# Patient Record
Sex: Male | Born: 1995 | Race: Black or African American | Hispanic: No | Marital: Single | Smoking: Never smoker
Health system: Southern US, Community
[De-identification: ages and names within clinical notes are randomized; demographics above are authoritative.]

## PROBLEM LIST (undated history)

## (undated) DIAGNOSIS — G43909 Migraine, unspecified, not intractable, without status migrainosus: Secondary | ICD-10-CM

---
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
## 2018-01-20 ENCOUNTER — Encounter (HOSPITAL_COMMUNITY): Payer: Self-pay | Admitting: Emergency Medicine

## 2018-01-20 ENCOUNTER — Ambulatory Visit (HOSPITAL_COMMUNITY)
Admission: EM | Admit: 2018-01-20 | Discharge: 2018-01-20 | Disposition: A | Discharge status: Home / Self Care | Payer: BLUE CROSS/BLUE SHIELD | Attending: Family Medicine | Admitting: Family Medicine

## 2018-01-20 DIAGNOSIS — R109 Unspecified abdominal pain: Secondary | ICD-10-CM | POA: Diagnosis present

## 2018-01-20 DIAGNOSIS — R1013 Epigastric pain: Secondary | ICD-10-CM | POA: Insufficient documentation

## 2018-01-20 DIAGNOSIS — R112 Nausea with vomiting, unspecified: Secondary | ICD-10-CM | POA: Insufficient documentation

## 2018-01-20 LAB — CBC WITH DIFFERENTIAL/PLATELET
Abs Immature Granulocytes: 0 10*3/uL (ref 0.0–0.1)
BASOS ABS: 0 10*3/uL (ref 0.0–0.1)
BASOS PCT: 0 %
EOS ABS: 0 10*3/uL (ref 0.0–0.7)
EOS PCT: 0 %
HCT: 51.2 % (ref 39.0–52.0)
Hemoglobin: 17.5 g/dL — ABNORMAL HIGH (ref 13.0–17.0)
IMMATURE GRANULOCYTES: 0 %
LYMPHS ABS: 1.6 10*3/uL (ref 0.7–4.0)
Lymphocytes Relative: 18 %
MCH: 30.3 pg (ref 26.0–34.0)
MCHC: 34.2 g/dL (ref 30.0–36.0)
MCV: 88.6 fL (ref 78.0–100.0)
Monocytes Absolute: 0.9 10*3/uL (ref 0.1–1.0)
Monocytes Relative: 10 %
NEUTROS PCT: 72 %
Neutro Abs: 6.5 10*3/uL (ref 1.7–7.7)
PLATELETS: 202 10*3/uL (ref 150–400)
RBC: 5.78 MIL/uL (ref 4.22–5.81)
RDW: 11.8 % (ref 11.5–15.5)
WBC: 9.1 10*3/uL (ref 4.0–10.5)

## 2018-01-20 LAB — COMPREHENSIVE METABOLIC PANEL
ALK PHOS: 87 U/L (ref 38–126)
ALT: 24 U/L (ref 17–63)
ANION GAP: 11 (ref 5–15)
AST: 27 U/L (ref 15–41)
Albumin: 4.8 g/dL (ref 3.5–5.0)
BUN: 17 mg/dL (ref 6–20)
CALCIUM: 10.5 mg/dL — AB (ref 8.9–10.3)
CO2: 35 mmol/L — AB (ref 22–32)
Chloride: 93 mmol/L — ABNORMAL LOW (ref 101–111)
Creatinine, Ser: 1.28 mg/dL — ABNORMAL HIGH (ref 0.61–1.24)
Glucose, Bld: 119 mg/dL — ABNORMAL HIGH (ref 65–99)
Potassium: 4.2 mmol/L (ref 3.5–5.1)
SODIUM: 139 mmol/L (ref 135–145)
TOTAL PROTEIN: 8.4 g/dL — AB (ref 6.5–8.1)
Total Bilirubin: 1.2 mg/dL (ref 0.3–1.2)

## 2018-01-20 LAB — LIPASE, BLOOD: LIPASE: 23 U/L (ref 11–51)

## 2018-01-20 MED ORDER — ONDANSETRON 4 MG PO TBDP
4.0000 mg | ORAL_TABLET | Freq: Once | ORAL | Status: AC
  Administered 2018-01-20: 4 mg via ORAL

## 2018-01-20 MED ORDER — ONDANSETRON 4 MG PO TBDP
ORAL_TABLET | ORAL | Status: AC
  Filled 2018-01-20: qty 1

## 2018-01-20 MED ORDER — ONDANSETRON 4 MG PO TBDP: 4.0000 mg | ORAL_TABLET | Freq: Three times a day (TID) | ORAL | 0 refills | Status: DC | PRN

## 2018-01-20 NOTE — ED Triage Notes (Signed)
Pt c/o upper abdominal pain x3 days, c/o waking up in the middle of the night vomiting.

## 2018-01-20 NOTE — ED Provider Notes (Signed)
[facility]    CSN: [id number] Arrival date & time: 01/20/18  [id number]     History   Chief Complaint Chief Complaint  Patient presents with  . Abdominal Pain    HPI Ian Daniel is a 22 y.o. male.   Ian Daniel presents with complaints of nausea vomiting and upper abdominal pain which started three days ago. States throat burns. No current abdominal pain. States the vomiting has been quite frequent, he feels it is worse if he stands up. If he hasn't eaten then it is bile which comes up. Last episode of vomiting approximately 2 hours ago. No diarrhea. States had a Bm this morning but has gone a little longer than normal for him without having a BM. Denies any previous similar. No recent travel. No known contaminated food intake prior to symptoms on set. No known ill contacts. No fevers. Has been drinking fluids but has had decreased food intake. Urinating normally. Has not taken any medications for symptoms. States drinks occasional alcohol and denies any illicit drug use. Without contributing medical history.      ROS per HPI.      Past Medical History:  Diagnosis Date  . Migraines     There are no active problems to display for this patient.   Past Surgical History:  Procedure Laterality Date  . I&D EXTREMITY Left 02/13/2013   Procedure: ORIF of left distal phalanx of fifth finger and nailbed repair;  Surgeon: Jodi Marbleavid A Thompson, MD;  [facility];  Service: Orthopedics;  Laterality: Left;       Home Medications    Prior to Admission medications   Medication Sig Start Date End Date Taking? Authorizing Provider  ondansetron (ZOFRAN-ODT) 4 MG disintegrating tablet Take 1 tablet (4 mg total) by mouth every 8 (eight) hours as needed for nausea or vomiting. 01/20/18   Georgetta HaberBurky, Natalie B, NP    Family History Family History  Problem Relation Age of Onset  . Fibromyalgia Paternal Grandmother     Social History Social History   Tobacco Use  . Smoking status: Never  Smoker  Substance Use Topics  . Alcohol use: Not on file  . Drug use: Not on file     Allergies   Patient has no known allergies.   Review of Systems Review of Systems   Physical Exam Triage Vital Signs ED Triage Vitals [01/20/18 1012]  Enc Vitals Group     BP 122/80     Pulse Rate 60     Resp 18     Temp 98 F (36.7 C)     Temp src      SpO2 99 %     Weight      Height      Head Circumference      Peak Flow      Pain Score      Pain Loc      Pain Edu?      Excl. in GC?    No data found.  Updated Vital Signs BP 122/80   Pulse 60   Temp 98 F (36.7 C)   Resp 18   SpO2 99%    Physical Exam  Constitutional: He is oriented to person, place, and time. He appears well-developed and well-nourished.  Cardiovascular: Normal rate and regular rhythm.  Pulmonary/Chest: Effort normal and breath sounds normal.  Abdominal: Soft. Bowel sounds are normal. There is no tenderness. There is no rigidity, no rebound, no guarding, no CVA tenderness, no tenderness  at McBurney's point and negative Murphy's sign.  Without any acute abdominal findings on exam; non tender  Neurological: He is alert and oriented to person, place, and time.  Skin: Skin is warm and dry.     UC Treatments / Results  Labs (all labs ordered are listed, but only abnormal results are displayed) Labs Reviewed  CBC WITH DIFFERENTIAL/PLATELET  COMPREHENSIVE METABOLIC PANEL  LIPASE, BLOOD    EKG None  Radiology No results found.  Procedures Procedures (including critical care time)  Medications Ordered in UC Medications  ondansetron (ZOFRAN-ODT) disintegrating tablet 4 mg (has no administration in time range)    Initial Impression / Assessment and Plan / UC Course  I have reviewed the triage vital signs and the nursing notes.  Pertinent labs & imaging results that were available during my care of the patient were reviewed by me and considered in my medical decision making (see chart for  details).     Afebrile, without tachycardia. Non toxic in exam, without acute abdominal findings. Labs collected, will call patient with any concerning results, does not have a PCP. zofran as needed. Liquids to be advanced to bland diet as tolerated. Return precautions provided. Patient verbalized understanding and agreeable to plan.    Final Clinical Impressions(s) / UC Diagnoses   Final diagnoses:  Non-intractable vomiting with nausea, unspecified vomiting type  Epigastric pain     Discharge Instructions     Small frequent sips of fluids- Pedialyte, Gatorade, water, broth- to maintain hydration.   Jello, broth or soups and liquids are important today, especially if still having pain. Zofran as needed for nausea.  If pain and nausea have improved may advance to bland diet as discussed in following pages.  If this is tolerated may advance to regular diet as tolerated. I will call if you have any concerning lab values or you may also check on your MyChart for my comments on your labs. If you develop worsening of pain, fevers, dehydration, no improvement in the next 48 hours or otherwise worsening please go to the Er.    ED Prescriptions    Medication Sig Dispense Auth. Provider   ondansetron (ZOFRAN-ODT) 4 MG disintegrating tablet Take 1 tablet (4 mg total) by mouth every 8 (eight) hours as needed for nausea or vomiting. 12 tablet Georgetta Haber, NP     Controlled Substance Prescriptions  Controlled Substance Registry consulted? Not Applicable   Georgetta Haber, NP 01/20/18 1037

## 2018-01-20 NOTE — Discharge Instructions (Signed)
Small frequent sips of fluids- Pedialyte, Gatorade, water, broth- to maintain hydration.   Jello, broth or soups and liquids are important today, especially if still having pain. Zofran as needed for nausea.  If pain and nausea have improved may advance to bland diet as discussed in following pages.  If this is tolerated may advance to regular diet as tolerated. I will call if you have any concerning lab values or you may also check on your MyChart for my comments on your labs. If you develop worsening of pain, fevers, dehydration, no improvement in the next 48 hours or otherwise worsening please go to the Er.

## 2018-04-29 ENCOUNTER — Encounter (HOSPITAL_COMMUNITY): Payer: Self-pay | Admitting: Emergency Medicine

## 2018-04-29 ENCOUNTER — Ambulatory Visit (HOSPITAL_COMMUNITY)
Admission: EM | Admit: 2018-04-29 | Discharge: 2018-04-29 | Disposition: A | Discharge status: Home / Self Care | Payer: BLUE CROSS/BLUE SHIELD | Attending: Urgent Care | Admitting: Urgent Care

## 2018-04-29 DIAGNOSIS — J029 Acute pharyngitis, unspecified: Secondary | ICD-10-CM | POA: Diagnosis not present

## 2018-04-29 DIAGNOSIS — R070 Pain in throat: Secondary | ICD-10-CM

## 2018-04-29 MED ORDER — AMOXICILLIN 400 MG/5ML PO SUSR: 800.0000 mg | Freq: Two times a day (BID) | ORAL | 0 refills | Status: DC

## 2018-04-29 NOTE — ED Provider Notes (Signed)
  MRN: [id number] DOB: 12/02/1995  Subjective:   Ian Daniel is a 22 y.o. male presenting for 4-day history of worsening sore throat, difficulty swallowing, difficulty speaking, subjective fever, malaise.  Patient has tried over-the-counter medications with minimal relief.  He is not currently taking any chronic medications.  No Known Allergies  Past Medical History:  Diagnosis Date  . Migraines      Past Surgical History:  Procedure Laterality Date  . I&D EXTREMITY Left 02/13/2013   Procedure: ORIF of left distal phalanx of fifth finger and nailbed repair;  Surgeon: Jodi Marble, MD;  Location: [facility] OR;  Service: Orthopedics;  Laterality: Left;    Objective:   Vitals: BP 122/77   Pulse 81   Temp 99.1 F (37.3 C)   Resp 16   SpO2 99%   Physical Exam  Constitutional: He is oriented to person, place, and time. He appears well-developed and well-nourished.  HENT:  Mouth/Throat: Oropharyngeal exudate and posterior oropharyngeal erythema (with associated tonsillar edema but airway is patent) present.  Malodorous breath.  Cardiovascular: Normal rate.  Pulmonary/Chest: Effort normal.  Neurological: He is alert and oriented to person, place, and time.   Assessment and Plan :   Pharyngitis, unspecified etiology  Throat pain  We will treat empirically for bacterial pharyngitis, start amoxicillin and use Tylenol and ibuprofen for supportive care. Return-to-clinic precautions discussed, patient verbalized understanding.    Wallis Bamberg, PA-C 04/29/18 1229

## 2018-04-29 NOTE — ED Triage Notes (Signed)
Pt c/o sore throat x4days.

## 2018-04-29 NOTE — Discharge Instructions (Signed)
You may take 500mg Tylenol with ibuprofen 400-600mg every 6 hours for pain and inflammation. °

## 2019-05-21 ENCOUNTER — Other Ambulatory Visit: Payer: Self-pay

## 2019-05-21 ENCOUNTER — Encounter (HOSPITAL_COMMUNITY): Payer: Self-pay

## 2019-05-21 ENCOUNTER — Ambulatory Visit (HOSPITAL_COMMUNITY)
Admission: EM | Admit: 2019-05-21 | Discharge: 2019-05-21 | Disposition: A | Discharge status: Home / Self Care | Payer: BC Managed Care – PPO | Attending: Family | Admitting: Family

## 2019-05-21 DIAGNOSIS — Z20828 Contact with and (suspected) exposure to other viral communicable diseases: Secondary | ICD-10-CM | POA: Diagnosis present

## 2019-05-21 DIAGNOSIS — R110 Nausea: Secondary | ICD-10-CM

## 2019-05-21 DIAGNOSIS — R197 Diarrhea, unspecified: Secondary | ICD-10-CM

## 2019-05-21 DIAGNOSIS — R0981 Nasal congestion: Secondary | ICD-10-CM | POA: Diagnosis present

## 2019-05-21 DIAGNOSIS — R1013 Epigastric pain: Secondary | ICD-10-CM | POA: Diagnosis present

## 2019-05-21 DIAGNOSIS — Z20822 Contact with and (suspected) exposure to covid-19: Secondary | ICD-10-CM

## 2019-05-21 MED ORDER — ONDANSETRON HCL 8 MG PO TABS: 8.0000 mg | ORAL_TABLET | Freq: Three times a day (TID) | ORAL | 0 refills | Status: AC | PRN

## 2019-05-21 MED ORDER — DICYCLOMINE HCL 20 MG PO TABS: 20.0000 mg | ORAL_TABLET | Freq: Two times a day (BID) | ORAL | 0 refills | Status: AC | PRN

## 2019-05-21 NOTE — ED Provider Notes (Signed)
[facility]    CSN: [id number] Arrival date & time: 05/21/19  1002      History   Chief Complaint Chief Complaint  Patient presents with  . Nausea    HPI Ian Daniel is a 23 y.o. male.   23 year old male presents with nausea for over 1 week. Was starting to improve but then developed decreased appetite and then loose stools and diarrhea about 3 to 4 days ago. Vomited 2 days ago but has not vomited since. Continues to have nausea and diarrhea, despite last eating bread and drinking tea over 18 hours ago. No distinct fever but having headaches, abdominal cramping and some pain. Also having mild nasal congestion and sore throat which is improving but no cough. Has been taking Nyquil with minimal relief. No known exposure to COVID 19 but roommate sick with similar GI symptoms but not as severe. No other chronic health issues except migraine headaches. Does not smoke. No pertinent family history. Takes no daily medication.   The history is provided by the patient.    Past Medical History:  Diagnosis Date  . Migraines     There are no active problems to display for this patient.   Past Surgical History:  Procedure Laterality Date  . I&D EXTREMITY Left 02/13/2013   Procedure: ORIF of left distal phalanx of fifth finger and nailbed repair;  Surgeon: Jodi Marbleavid A Thompson, MD;  Location: [facility];  Service: Orthopedics;  Laterality: Left;       Home Medications    Prior to Admission medications   Medication Sig Start Date End Date Taking? Authorizing Provider  dicyclomine (BENTYL) 20 MG tablet Take 1 tablet (20 mg total) by mouth 2 (two) times daily as needed (stomach pain and spasms). 05/21/19   Sudie GrumblingAmyot, Shyasia Funches Berry, NP  ondansetron (ZOFRAN) 8 MG tablet Take 1 tablet (8 mg total) by mouth every 8 (eight) hours as needed for nausea or vomiting. 05/21/19   Sudie GrumblingAmyot, Luka Stohr Berry, NP    Family History Family History  Problem Relation Age of Onset  . Fibromyalgia Paternal Grandmother      Social History Social History   Tobacco Use  . Smoking status: Never Smoker  Substance Use Topics  . Alcohol use: Not on file  . Drug use: Not on file     Allergies   Patient has no known allergies.   Review of Systems Review of Systems  Constitutional: Positive for appetite change and fatigue. Negative for activity change, chills, diaphoresis and fever.  HENT: Positive for congestion (mild nasal) and sore throat. Negative for ear discharge, ear pain, facial swelling, mouth sores, nosebleeds, postnasal drip, rhinorrhea, sinus pressure, sinus pain, sneezing and trouble swallowing.   Eyes: Negative for photophobia, pain, discharge, redness, itching and visual disturbance.  Respiratory: Negative for cough, chest tightness, shortness of breath and wheezing.   Gastrointestinal: Positive for abdominal pain, diarrhea, nausea and vomiting. Negative for blood in stool and constipation.  Genitourinary: Negative for decreased urine volume, difficulty urinating, discharge, dysuria, flank pain, hematuria, penile pain and urgency.  Musculoskeletal: Negative for arthralgias, back pain and myalgias.  Skin: Negative for color change, rash and wound.  Allergic/Immunologic: Negative for environmental allergies, food allergies and immunocompromised state.  Neurological: Positive for headaches. Negative for dizziness, tremors, seizures, syncope, weakness, light-headedness and numbness.  Hematological: Negative for adenopathy. Does not bruise/bleed easily.     Physical Exam Triage Vital Signs ED Triage Vitals  Enc Vitals Group     BP  05/21/19 1017 121/73     Pulse Rate 05/21/19 1017 71     Resp 05/21/19 1017 18     Temp 05/21/19 1017 97.7 F (36.5 C)     Temp Source 05/21/19 1017 Oral     SpO2 05/21/19 1017 100 %     Weight --      Height --      Head Circumference --      Peak Flow --      Pain Score 05/21/19 1015 4     Pain Loc --      Pain Edu? --      Excl. in GC? --    No  data found.  Updated Vital Signs BP 121/73   Pulse 71   Temp 97.7 F (36.5 C) (Oral)   Resp 18   SpO2 100%   Visual Acuity Right Eye Distance:   Left Eye Distance:   Bilateral Distance:    Right Eye Near:   Left Eye Near:    Bilateral Near:     Physical Exam Vitals signs and nursing note reviewed.  Constitutional:      General: He is awake. He is not in acute distress.    Appearance: He is well-developed, well-groomed and normal weight. He is ill-appearing.     Comments: Patient sitting comfortably on exam table in no acute distress but appears ill.   HENT:     Head: Normocephalic and atraumatic.     Right Ear: Hearing, tympanic membrane, ear canal and external ear normal.     Left Ear: Hearing, tympanic membrane, ear canal and external ear normal.     Nose: Congestion present. No rhinorrhea.     Right Sinus: No maxillary sinus tenderness or frontal sinus tenderness.     Left Sinus: No maxillary sinus tenderness or frontal sinus tenderness.     Mouth/Throat:     Lips: Pink.     Mouth: Mucous membranes are moist.     Pharynx: Oropharynx is clear. Uvula midline. No pharyngeal swelling, oropharyngeal exudate, posterior oropharyngeal erythema or uvula swelling.  Eyes:     Extraocular Movements: Extraocular movements intact.     Conjunctiva/sclera: Conjunctivae normal.  Neck:     Musculoskeletal: Normal range of motion and neck supple. No neck rigidity or muscular tenderness.  Cardiovascular:     Rate and Rhythm: Normal rate and regular rhythm.     Pulses: Normal pulses.     Heart sounds: Normal heart sounds. No murmur.  Pulmonary:     Effort: Pulmonary effort is normal. No respiratory distress.     Breath sounds: Normal breath sounds and air entry. No stridor or decreased air movement. No decreased breath sounds, wheezing, rhonchi or rales.  Abdominal:     General: Abdomen is flat. Bowel sounds are normal.     Palpations: Abdomen is soft.     Tenderness: There is  abdominal tenderness in the epigastric area and left lower quadrant. There is no right CVA tenderness, left CVA tenderness, guarding or rebound.  Musculoskeletal: Normal range of motion.  Lymphadenopathy:     Cervical: No cervical adenopathy.  Skin:    General: Skin is warm and dry.     Capillary Refill: Capillary refill takes less than 2 seconds.     Findings: No rash.  Neurological:     General: No focal deficit present.     Mental Status: He is alert and oriented to person, place, and time.  Psychiatric:  Mood and Affect: Mood normal.        Behavior: Behavior normal. Behavior is cooperative.        Thought Content: Thought content normal.        Judgment: Judgment normal.      UC Treatments / Results  Labs (all labs ordered are listed, but only abnormal results are displayed) Labs Reviewed  NOVEL CORONAVIRUS, NAA (HOSP ORDER, SEND-OUT TO REF LAB; TAT 18-24 HRS)    EKG   Radiology No results found.  Procedures Procedures (including critical care time)  Medications Ordered in UC Medications - No data to display  Initial Impression / Assessment and Plan / UC Course  I have reviewed the triage vital signs and the nursing notes.  Pertinent labs & imaging results that were available during my care of the patient were reviewed by me and considered in my medical decision making (see chart for details).    Patient had many questions and discussed possible etiology for over 15 minutes. Discussed with patient that he probably has a viral illness- COVID 19 testing performed. Also provided specimen collection for patient to bring back for GI stool evaluation. Recommend start Zofran 8mg  every 8 hours as needed for nausea and vomiting. May take Bentyl 20mg  every 12 hours as needed for stomach pain and cramping. Slowly increase water and fluid intake- especially Gatorade or Powerade. Rest. Stay at home. Note written for work. Follow-up pending COVID 19 test results and GI panel  test results.  Final Clinical Impressions(s) / UC Diagnoses   Final diagnoses:  Nausea  Diarrhea, unspecified type  Abdominal pain, epigastric  Nasal congestion     Discharge Instructions     Recommend start Zofran 8mg  every 8 hours as needed for nausea and vomiting. May also take Bentyl 20mg  twice a day as needed for abdominal pain/spasms. Continue to slowly increase fluids- particularly Gatorade or Powerade. Recommend rest. Stay at home. Do not go to work. Follow-up pending COVID 19 test results and GI stool test results.     ED Prescriptions    Medication Sig Dispense Auth. Provider   ondansetron (ZOFRAN) 8 MG tablet Take 1 tablet (8 mg total) by mouth every 8 (eight) hours as needed for nausea or vomiting. 15 tablet Danile Trier, Nicholes Stairs, NP   dicyclomine (BENTYL) 20 MG tablet Take 1 tablet (20 mg total) by mouth 2 (two) times daily as needed (stomach pain and spasms). 20 tablet Christoher Drudge, Nicholes Stairs, NP     PDMP not reviewed this encounter.   Katy Apo, NP 05/21/19 2220

## 2019-05-21 NOTE — ED Triage Notes (Signed)
Pt does request covid testing for work

## 2019-05-21 NOTE — ED Triage Notes (Signed)
Pt states that he began having nausea last Sunday , improved then began vomiting on Friday, pt reports poor appetite and diarrhea as well with mild abdominal pain

## 2019-05-21 NOTE — Discharge Instructions (Addendum)
Recommend start Zofran 8mg  every 8 hours as needed for nausea and vomiting. May also take Bentyl 20mg  twice a day as needed for abdominal pain/spasms. Continue to slowly increase fluids- particularly Gatorade or Powerade. Recommend rest. Stay at home. Do not go to work. Follow-up pending COVID 19 test results and GI stool test results.

## 2019-05-22 LAB — NOVEL CORONAVIRUS, NAA (HOSP ORDER, SEND-OUT TO REF LAB; TAT 18-24 HRS): SARS-CoV-2, NAA: NOT DETECTED

## 2019-05-23 ENCOUNTER — Telehealth (HOSPITAL_COMMUNITY): Payer: Self-pay | Admitting: Emergency Medicine

## 2019-05-23 NOTE — Telephone Encounter (Signed)
Record review

## 2019-05-25 ENCOUNTER — Other Ambulatory Visit: Payer: Self-pay

## 2019-05-25 ENCOUNTER — Ambulatory Visit (HOSPITAL_COMMUNITY)
Admission: EM | Admit: 2019-05-25 | Discharge: 2019-05-25 | Disposition: A | Discharge status: Home / Self Care | Payer: BC Managed Care – PPO | Attending: Family Medicine | Admitting: Family Medicine

## 2019-05-25 ENCOUNTER — Encounter (HOSPITAL_COMMUNITY): Payer: Self-pay

## 2019-05-25 DIAGNOSIS — R197 Diarrhea, unspecified: Secondary | ICD-10-CM | POA: Diagnosis not present

## 2019-05-25 LAB — GI PATHOGEN PANEL BY PCR, STOOL

## 2019-05-25 MED ORDER — DIPHENOXYLATE-ATROPINE 2.5-0.025 MG PO TABS: 1.0000 | ORAL_TABLET | Freq: Four times a day (QID) | ORAL | 0 refills | Status: AC | PRN

## 2019-05-25 NOTE — ED Triage Notes (Signed)
Pt presents to UC stating he would like a "full body workup". Pt reports states he has intermittent nausea and vomiting and diarrhea after eating x1 week.

## 2019-05-25 NOTE — Discharge Instructions (Addendum)
Take the medicine for diarrhea AS NEEDED, only when diarrhea watery You may only need one or two pills Keep drinking lots of fluids Bland diet We will call with your GI infection test results

## 2019-05-25 NOTE — ED Provider Notes (Signed)
[facility]    CSN: [id number] Arrival date & time: 05/25/19  1342      History   Chief Complaint No chief complaint on file.   HPI Ian Daniel is a 23 y.o. male.   HPI  Patient was seen here on 05/21/2019 for nausea vomiting and diarrhea.  He had coronavirus testing done that was negative.  He had a GI pathogen panel done which is not yet back.  He called today for his test results and was informed that his GI pathogen test was not yet available.  He is here because he still having symptoms.  He needs a note for work.  He states that if he eats frequent small meals that he does better than if he tries to eat a lot.  He is drinking plenty of fluids.  He states almost every time he eats something solid he has cramping and then watery diarrhea.  He has not had any vomiting for 2 days.  He states he does not feel like he has the strength to do the repetitive lifting at [employer] because he has not eaten a solid meal for almost a week.  He has no fever chills.  No body aches. His roommate was also sick with some GI symptoms but he is gotten better. Denies any travel.  He has not eaten any suspicious foods.  Is never had food poisoning.  He states when he has had gastroenteritis in the past it is never lasted this long. No dizziness.  No significant abdominal pain.  No blood or mucus in the diarrhea.  Past Medical History:  Diagnosis Date  . Migraines     There are no active problems to display for this patient.   Past Surgical History:  Procedure Laterality Date  . I&D EXTREMITY Left 02/13/2013   Procedure: ORIF of left distal phalanx of fifth finger and nailbed repair;  Surgeon: Jodi Marble, MD;  Location: [facility] OR;  Service: Orthopedics;  Laterality: Left;       Home Medications    Prior to Admission medications   Medication Sig Start Date End Date Taking? Authorizing Provider  dicyclomine (BENTYL) 20 MG tablet Take 1 tablet (20 mg total) by mouth 2 (two) times daily  as needed (stomach pain and spasms). 05/21/19   Sudie Grumbling, NP  diphenoxylate-atropine (LOMOTIL) 2.5-0.025 MG tablet Take 1 tablet by mouth 4 (four) times daily as needed for diarrhea or loose stools. 05/25/19   Eustace Moore, MD  ondansetron (ZOFRAN) 8 MG tablet Take 1 tablet (8 mg total) by mouth every 8 (eight) hours as needed for nausea or vomiting. 05/21/19   Sudie Grumbling, NP    Family History Family History  Problem Relation Age of Onset  . Healthy Mother   . Healthy Father   . Fibromyalgia Paternal Grandmother     Social History Social History   Tobacco Use  . Smoking status: Never Smoker  . Smokeless tobacco: Never Used  Substance Use Topics  . Alcohol use: Never    Frequency: Never  . Drug use: Not on file     Allergies   Patient has no known allergies.   Review of Systems Review of Systems  Constitutional: Positive for fatigue. Negative for chills and fever.  HENT: Negative for ear pain and sore throat.   Eyes: Negative for pain and visual disturbance.  Respiratory: Negative for cough and shortness of breath.   Cardiovascular: Negative for chest pain and palpitations.  Gastrointestinal: Positive for diarrhea and nausea. Negative for abdominal pain and vomiting.  Genitourinary: Negative for dysuria and hematuria.  Musculoskeletal: Negative for arthralgias and back pain.  Skin: Negative for color change and rash.  Neurological: Negative for seizures and syncope.  All other systems reviewed and are negative.    Physical Exam Triage Vital Signs ED Triage Vitals  Enc Vitals Group     BP 05/25/19 1410 (!) 142/70     Pulse Rate 05/25/19 1410 77     Resp --      Temp 05/25/19 1415 98 F (36.7 C)     Temp Source 05/25/19 1415 Oral     SpO2 05/25/19 1410 100 %     Weight --      Height --      Head Circumference --      Peak Flow --      Pain Score 05/25/19 1416 1     Pain Loc --      Pain Edu? --      Excl. in [place]? --    No data found.   Updated Vital Signs BP (!) 142/70 (BP Location: Left Arm)   Pulse 77   Temp 98 F (36.7 C) (Oral)   SpO2 100%   Visual Acuity Right Eye Distance:   Left Eye Distance:   Bilateral Distance:    Right Eye Near:   Left Eye Near:    Bilateral Near:     Physical Exam Constitutional:      General: He is not in acute distress.    Appearance: He is well-developed.     Comments: Tall and thin.  No acute distress  HENT:     Head: Normocephalic and atraumatic.     Mouth/Throat:     Mouth: Mucous membranes are moist.     Pharynx: No posterior oropharyngeal erythema.  Eyes:     Conjunctiva/sclera: Conjunctivae normal.     Pupils: Pupils are equal, round, and reactive to light.  Neck:     Musculoskeletal: Normal range of motion.  Cardiovascular:     Rate and Rhythm: Normal rate and regular rhythm.     Heart sounds: Normal heart sounds.  Pulmonary:     Effort: Pulmonary effort is normal. No respiratory distress.     Breath sounds: Normal breath sounds.  Abdominal:     General: Abdomen is flat. Bowel sounds are normal. There is no distension.     Palpations: Abdomen is soft.     Comments: Active bowel sounds.  Mild tenderness to deep palpation in the left lower quadrant  Musculoskeletal: Normal range of motion.  Skin:    General: Skin is warm and dry.  Neurological:     Mental Status: He is alert.      UC Treatments / Results  Labs (all labs ordered are listed, but only abnormal results are displayed) Labs Reviewed - No data to display  EKG   Radiology No results found.  Procedures Procedures (including critical care time)  Medications Ordered in UC Medications - No data to display  Initial Impression / Assessment and Plan / UC Course  I have reviewed the triage vital signs and the nursing notes.  Pertinent labs & imaging results that were available during my care of the patient were reviewed by me and considered in my medical decision making (see chart for  details).     I reviewed with the patient that it is not uncommon after a bad gastroenteritis to be left  with stomach irritation that can last for 3 weeks.  Lactose intolerance is common.  We are still waiting for his gastrointestinal pathogen panel.  Until then is just a matter of treating the symptoms.  He states that he is very tired of diarrhea after over a week, and he is having some irritation around his rectum.  I am going to give him a small number of Lomotil to try to stop the diarrhea. Final Clinical Impressions(s) / UC Diagnoses   Final diagnoses:  Diarrhea of presumed infectious origin     Discharge Instructions     Take the medicine for diarrhea AS NEEDED, only when diarrhea watery You may only need one or two pills Keep drinking lots of fluids Bland diet We will call with your GI infection test results   ED Prescriptions    Medication Sig Dispense Auth. Provider   diphenoxylate-atropine (LOMOTIL) 2.5-0.025 MG tablet Take 1 tablet by mouth 4 (four) times daily as needed for diarrhea or loose stools. 8 tablet Eustace MooreNelson, Haru Anspaugh Sue, MD     PDMP not reviewed this encounter.   Eustace MooreNelson, Maureen Duesing Sue, MD 05/25/19 [phone]
